# Patient Record
Sex: Male | Born: 1975
Health system: Southern US, Community
[De-identification: ages and names within clinical notes are randomized; demographics above are authoritative.]

## PROBLEM LIST (undated history)

## (undated) DIAGNOSIS — I10 Essential (primary) hypertension: Secondary | ICD-10-CM

## (undated) HISTORY — PX: ADRENALECTOMY: SHX876

---

## 2001-06-18 ENCOUNTER — Ambulatory Visit (HOSPITAL_COMMUNITY): Admission: RE | Admit: 2001-06-18 | Discharge: 2001-06-18 | Payer: Self-pay | Admitting: Pulmonary Disease

## 2016-03-16 DIAGNOSIS — E2601 Conn's syndrome: Secondary | ICD-10-CM | POA: Diagnosis not present

## 2016-03-16 DIAGNOSIS — Z79899 Other long term (current) drug therapy: Secondary | ICD-10-CM | POA: Diagnosis not present

## 2016-03-16 DIAGNOSIS — I10 Essential (primary) hypertension: Secondary | ICD-10-CM | POA: Diagnosis not present

## 2016-03-16 DIAGNOSIS — E785 Hyperlipidemia, unspecified: Secondary | ICD-10-CM | POA: Diagnosis not present

## 2016-03-23 DIAGNOSIS — Z6824 Body mass index (BMI) 24.0-24.9, adult: Secondary | ICD-10-CM | POA: Diagnosis not present

## 2016-03-23 DIAGNOSIS — E781 Pure hyperglyceridemia: Secondary | ICD-10-CM | POA: Diagnosis not present

## 2016-03-23 DIAGNOSIS — I10 Essential (primary) hypertension: Secondary | ICD-10-CM | POA: Diagnosis not present

## 2016-04-16 DIAGNOSIS — L259 Unspecified contact dermatitis, unspecified cause: Secondary | ICD-10-CM | POA: Diagnosis not present

## 2016-04-27 DIAGNOSIS — R5382 Chronic fatigue, unspecified: Secondary | ICD-10-CM | POA: Diagnosis not present

## 2016-04-27 DIAGNOSIS — R21 Rash and other nonspecific skin eruption: Secondary | ICD-10-CM | POA: Diagnosis not present

## 2016-08-17 DIAGNOSIS — R21 Rash and other nonspecific skin eruption: Secondary | ICD-10-CM | POA: Diagnosis not present

## 2016-08-17 DIAGNOSIS — L239 Allergic contact dermatitis, unspecified cause: Secondary | ICD-10-CM | POA: Diagnosis not present

## 2016-08-17 DIAGNOSIS — S80862A Insect bite (nonvenomous), left lower leg, initial encounter: Secondary | ICD-10-CM | POA: Diagnosis not present

## 2016-08-17 DIAGNOSIS — S80861A Insect bite (nonvenomous), right lower leg, initial encounter: Secondary | ICD-10-CM | POA: Diagnosis not present

## 2016-09-25 DIAGNOSIS — I10 Essential (primary) hypertension: Secondary | ICD-10-CM | POA: Diagnosis not present

## 2017-03-26 DIAGNOSIS — Z79899 Other long term (current) drug therapy: Secondary | ICD-10-CM | POA: Diagnosis not present

## 2017-03-26 DIAGNOSIS — I10 Essential (primary) hypertension: Secondary | ICD-10-CM | POA: Diagnosis not present

## 2017-03-26 DIAGNOSIS — E2601 Conn's syndrome: Secondary | ICD-10-CM | POA: Diagnosis not present

## 2017-04-02 DIAGNOSIS — Z6824 Body mass index (BMI) 24.0-24.9, adult: Secondary | ICD-10-CM | POA: Diagnosis not present

## 2017-04-02 DIAGNOSIS — I10 Essential (primary) hypertension: Secondary | ICD-10-CM | POA: Diagnosis not present

## 2017-07-25 DIAGNOSIS — L218 Other seborrheic dermatitis: Secondary | ICD-10-CM | POA: Diagnosis not present

## 2017-10-08 DIAGNOSIS — I10 Essential (primary) hypertension: Secondary | ICD-10-CM | POA: Diagnosis not present

## 2018-01-07 DIAGNOSIS — R000 Tachycardia, unspecified: Secondary | ICD-10-CM | POA: Diagnosis not present

## 2018-01-07 DIAGNOSIS — I10 Essential (primary) hypertension: Secondary | ICD-10-CM | POA: Diagnosis not present

## 2018-02-07 ENCOUNTER — Emergency Department (HOSPITAL_COMMUNITY): Payer: 59

## 2018-02-07 ENCOUNTER — Other Ambulatory Visit: Payer: Self-pay

## 2018-02-07 ENCOUNTER — Encounter (HOSPITAL_COMMUNITY): Payer: Self-pay | Admitting: Emergency Medicine

## 2018-02-07 ENCOUNTER — Emergency Department (HOSPITAL_COMMUNITY)
Admission: EM | Admit: 2018-02-07 | Discharge: 2018-02-07 | Disposition: A | Discharge status: Home / Self Care | Payer: 59 | Attending: Emergency Medicine | Admitting: Emergency Medicine

## 2018-02-07 DIAGNOSIS — Y92009 Unspecified place in unspecified non-institutional (private) residence as the place of occurrence of the external cause: Secondary | ICD-10-CM | POA: Diagnosis not present

## 2018-02-07 DIAGNOSIS — Y939 Activity, unspecified: Secondary | ICD-10-CM | POA: Diagnosis not present

## 2018-02-07 DIAGNOSIS — Y999 Unspecified external cause status: Secondary | ICD-10-CM | POA: Diagnosis not present

## 2018-02-07 DIAGNOSIS — W228XXA Striking against or struck by other objects, initial encounter: Secondary | ICD-10-CM | POA: Insufficient documentation

## 2018-02-07 DIAGNOSIS — I10 Essential (primary) hypertension: Secondary | ICD-10-CM | POA: Insufficient documentation

## 2018-02-07 DIAGNOSIS — S2232XA Fracture of one rib, left side, initial encounter for closed fracture: Secondary | ICD-10-CM | POA: Diagnosis not present

## 2018-02-07 DIAGNOSIS — S299XXA Unspecified injury of thorax, initial encounter: Secondary | ICD-10-CM | POA: Diagnosis not present

## 2018-02-07 DIAGNOSIS — R0781 Pleurodynia: Secondary | ICD-10-CM | POA: Diagnosis not present

## 2018-02-07 HISTORY — DX: Essential (primary) hypertension: I10

## 2018-02-07 MED ORDER — LIDOCAINE 5 % EX PTCH: 1.0000 | MEDICATED_PATCH | CUTANEOUS | 0 refills | Status: AC
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
## 2018-02-07 NOTE — Progress Notes (Signed)
**Note De-Identified James Skinner Obfuscation** Patient refused IS therapy; PA aware.

## 2018-02-07 NOTE — Discharge Instructions (Addendum)
You may continue using your ibuprofen to help with pain relief.  I I recommend no more than 12 tablets in a 24-hour period of time.  This means you can take 3 tablets every 6 hours, or 4 tablets every 8 hours which is the maximum safe dosing.  I have also prescribed you a pain patch which may be helpful.  Avoid any activity that makes your pain worse.  If you feel the urge to cough or sneeze he will want to splint the site to help minimize pain.  Get rechecked by your primary doctor if you have any worsening symptoms.  Expect that this injury will take 3 to 4 weeks to completely resolve.

## 2018-02-07 NOTE — ED Notes (Signed)
Respiratory made aware of incentive spirometry order at this time.
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
## 2018-04-07 DIAGNOSIS — I10 Essential (primary) hypertension: Secondary | ICD-10-CM | POA: Diagnosis not present

## 2018-04-07 DIAGNOSIS — Z79899 Other long term (current) drug therapy: Secondary | ICD-10-CM | POA: Diagnosis not present

## 2018-04-07 DIAGNOSIS — R000 Tachycardia, unspecified: Secondary | ICD-10-CM | POA: Diagnosis not present

## 2018-04-11 DIAGNOSIS — Z6824 Body mass index (BMI) 24.0-24.9, adult: Secondary | ICD-10-CM | POA: Diagnosis not present

## 2018-04-11 DIAGNOSIS — I10 Essential (primary) hypertension: Secondary | ICD-10-CM | POA: Diagnosis not present

## 2018-04-11 DIAGNOSIS — N189 Chronic kidney disease, unspecified: Secondary | ICD-10-CM | POA: Diagnosis not present

## 2020-06-21 IMAGING — DX DG RIBS W/ CHEST 3+V*L*
4 series · 4 of 4 positions shown · non-contrast
Comparison: None.

Addendum:
CLINICAL DATA: Injury.  Left rib pain

EXAM:
LEFT RIBS AND CHEST - 3+ VIEW

[chest pa]
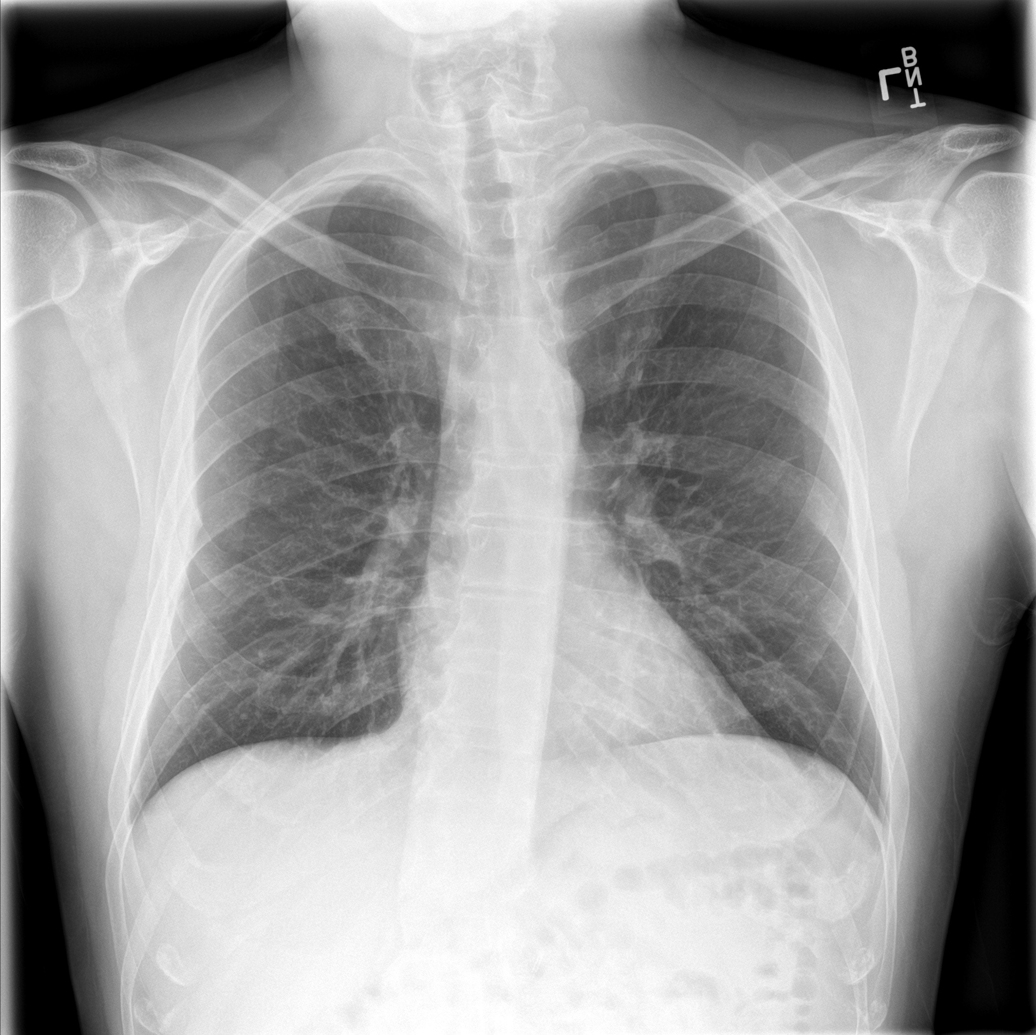

[rib pa obl (1 of 2)]
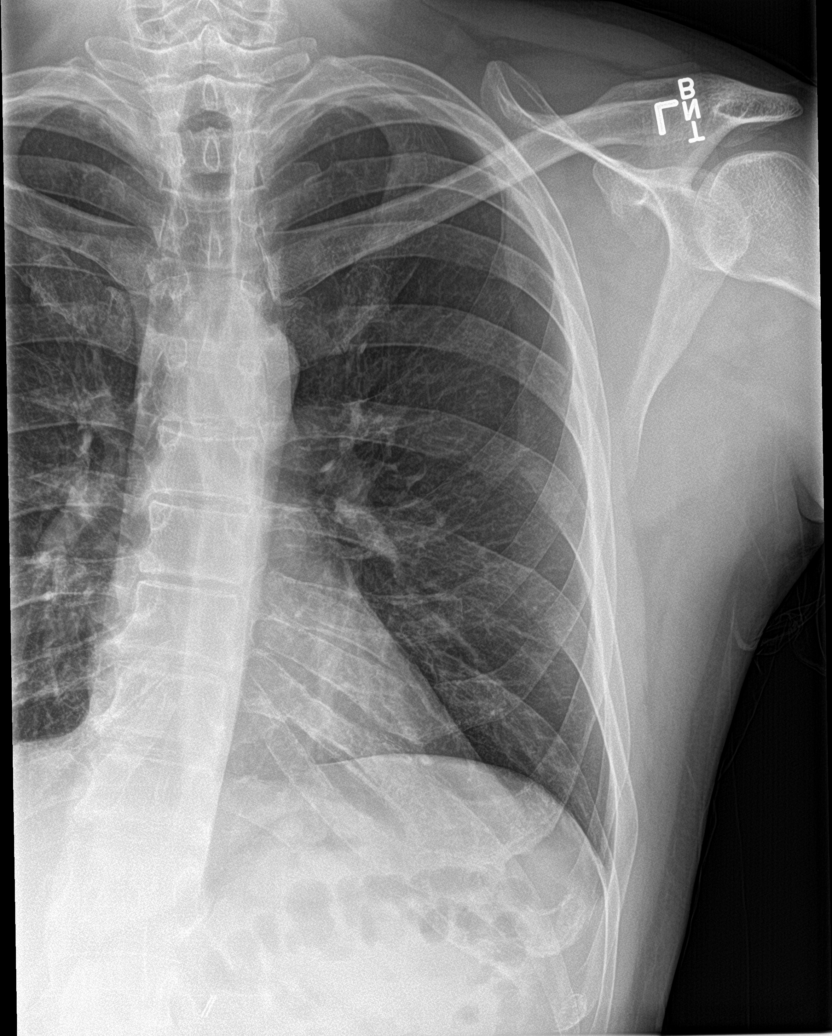

[rib pa obl (2 of 2)]
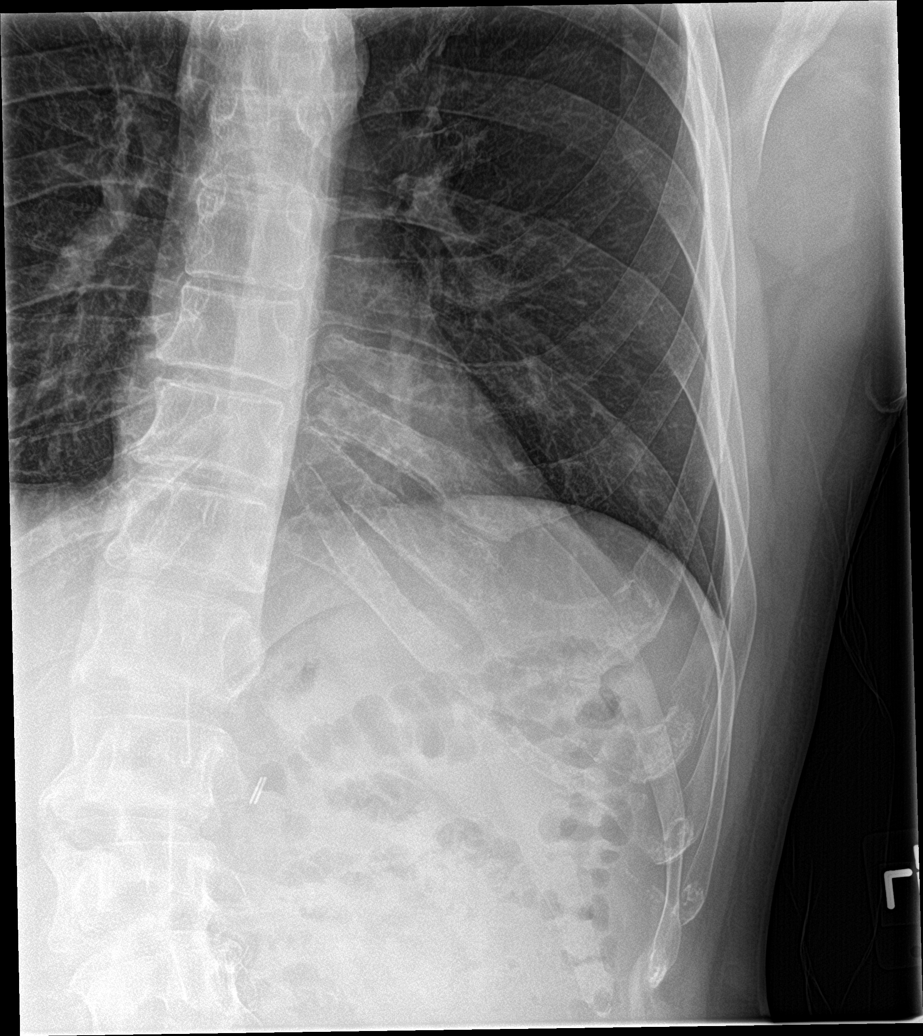

[rib pa]
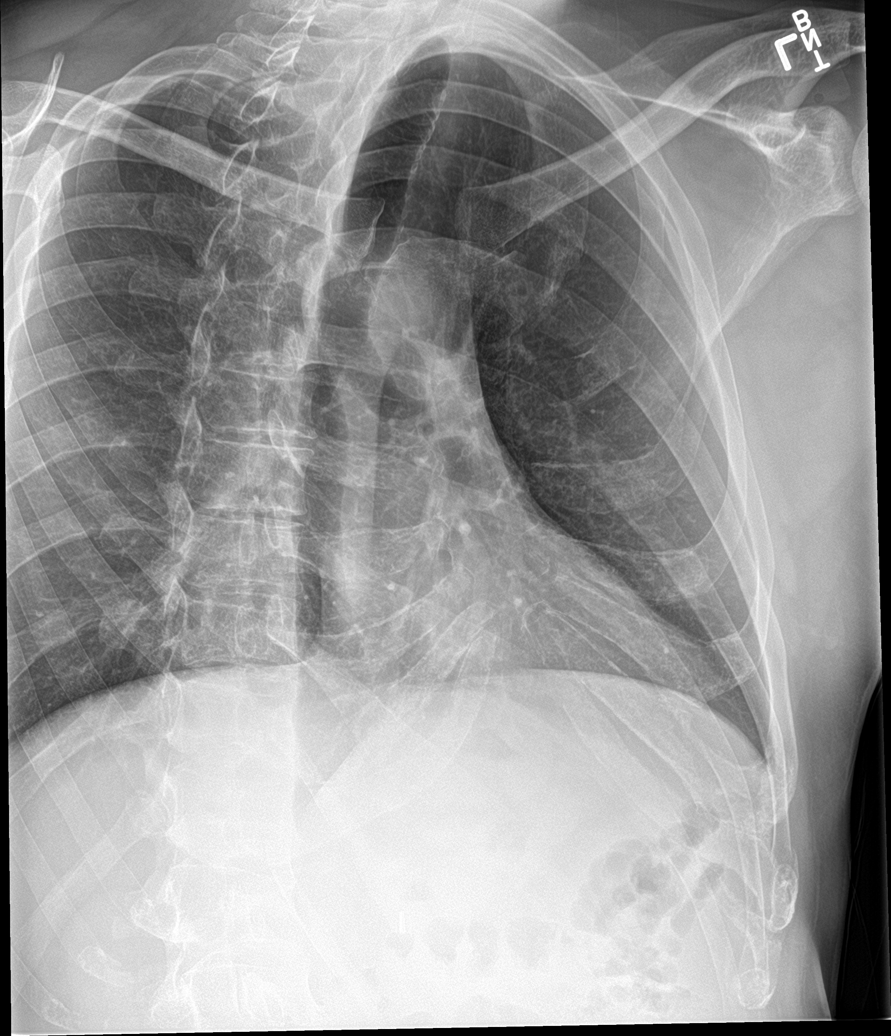

[4 of 4 positions shown; findings below may reference images not displayed]

FINDINGS: No fracture or other bone lesions are seen involving the ribs. There
is no evidence of pneumothorax or pleural effusion. Both lungs are
clear. Heart size and mediastinal contours are within normal limits.
IMPRESSION: Negative.

ADDENDUM:
Mild irregularity of the seventh rib anteriorly near the
costochondral junction could represent a fracture. The patient is
tender in this area.

*** End of Addendum ***
